# Patient Record
Sex: Female | Born: 1975 | Race: White | Marital: Married | Smoking: Never smoker
Health system: Southern US, Community
[De-identification: ages and names within clinical notes are randomized; demographics above are authoritative.]

## PROBLEM LIST (undated history)

## (undated) DIAGNOSIS — Z992 Dependence on renal dialysis: Secondary | ICD-10-CM

## (undated) DIAGNOSIS — E039 Hypothyroidism, unspecified: Secondary | ICD-10-CM

## (undated) DIAGNOSIS — N1370 Vesicoureteral-reflux, unspecified: Secondary | ICD-10-CM

## (undated) DIAGNOSIS — I10 Essential (primary) hypertension: Secondary | ICD-10-CM

## (undated) HISTORY — PX: PERITONEAL CATHETER INSERTION: SHX2223

## (undated) HISTORY — PX: KIDNEY TRANSPLANT: SHX239

---

## 2010-09-26 ENCOUNTER — Other Ambulatory Visit: Payer: Self-pay | Admitting: Family Medicine

## 2010-09-26 ENCOUNTER — Ambulatory Visit
Admission: RE | Admit: 2010-09-26 | Discharge: 2010-09-26 | Disposition: A | Discharge status: Home / Self Care | Payer: Medicare Other | Source: Ambulatory Visit | Attending: Family Medicine | Admitting: Family Medicine

## 2010-09-26 DIAGNOSIS — M549 Dorsalgia, unspecified: Secondary | ICD-10-CM

## 2010-12-29 ENCOUNTER — Ambulatory Visit (INDEPENDENT_AMBULATORY_CARE_PROVIDER_SITE_OTHER): Payer: Medicare Other | Admitting: Internal Medicine

## 2011-02-07 ENCOUNTER — Inpatient Hospital Stay (INDEPENDENT_AMBULATORY_CARE_PROVIDER_SITE_OTHER)
Admission: RE | Admit: 2011-02-07 | Discharge: 2011-02-07 | Disposition: A | Discharge status: Home / Self Care | Payer: Medicare Other | Source: Ambulatory Visit | Attending: Family Medicine | Admitting: Family Medicine

## 2011-02-07 DIAGNOSIS — L03119 Cellulitis of unspecified part of limb: Secondary | ICD-10-CM

## 2011-02-10 ENCOUNTER — Inpatient Hospital Stay (HOSPITAL_COMMUNITY)
Admission: RE | Admit: 2011-02-10 | Discharge: 2011-02-10 | Disposition: A | Discharge status: Home / Self Care | Payer: Medicare Other | Source: Ambulatory Visit | Attending: Emergency Medicine | Admitting: Emergency Medicine

## 2011-07-08 IMAGING — CR DG SI JOINTS 3+V
3 series · 3 of 3 positions shown · non-contrast
Comparison: None.

CLINICAL DATA: Low back and hip pain, no injury

BILATERAL SACROILIAC JOINTS - 3+ VIEW

[view not recorded (1 of 3)]
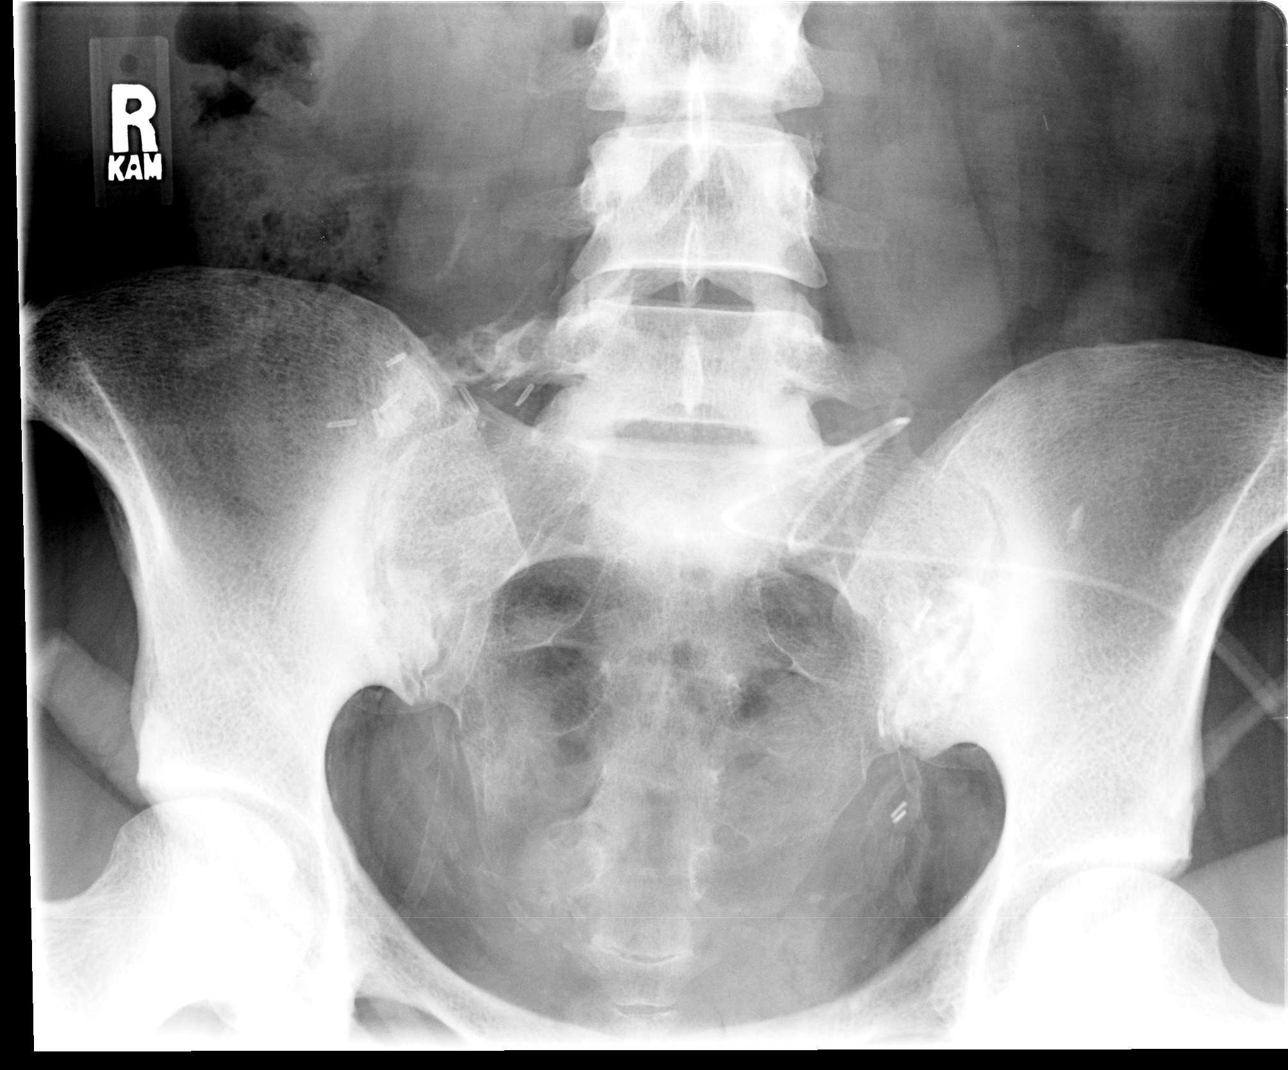

[view not recorded (2 of 3)]
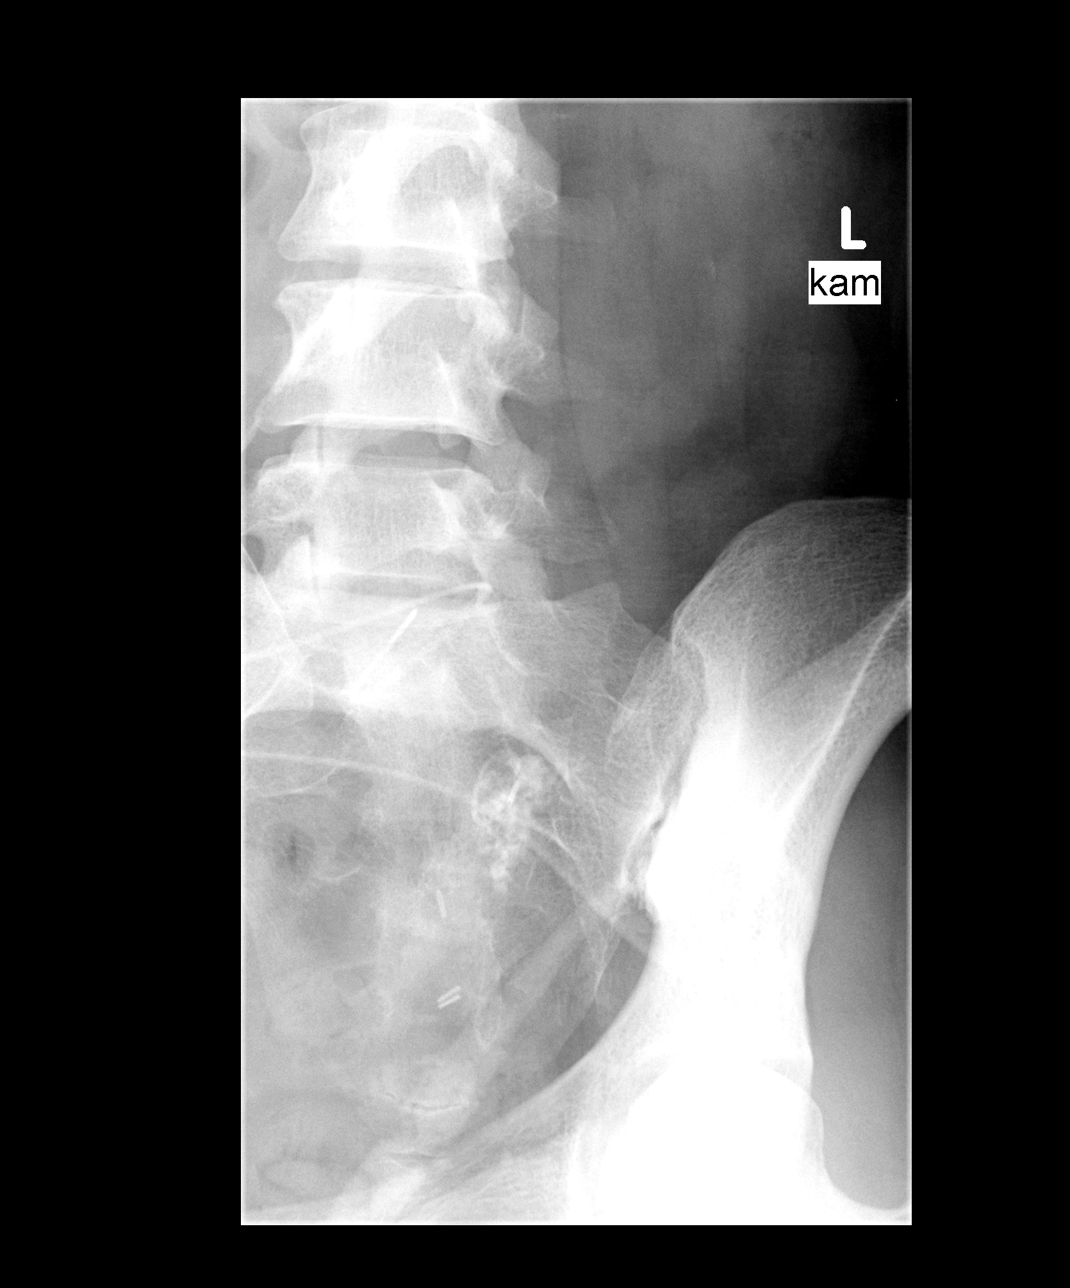

[view not recorded (3 of 3)]
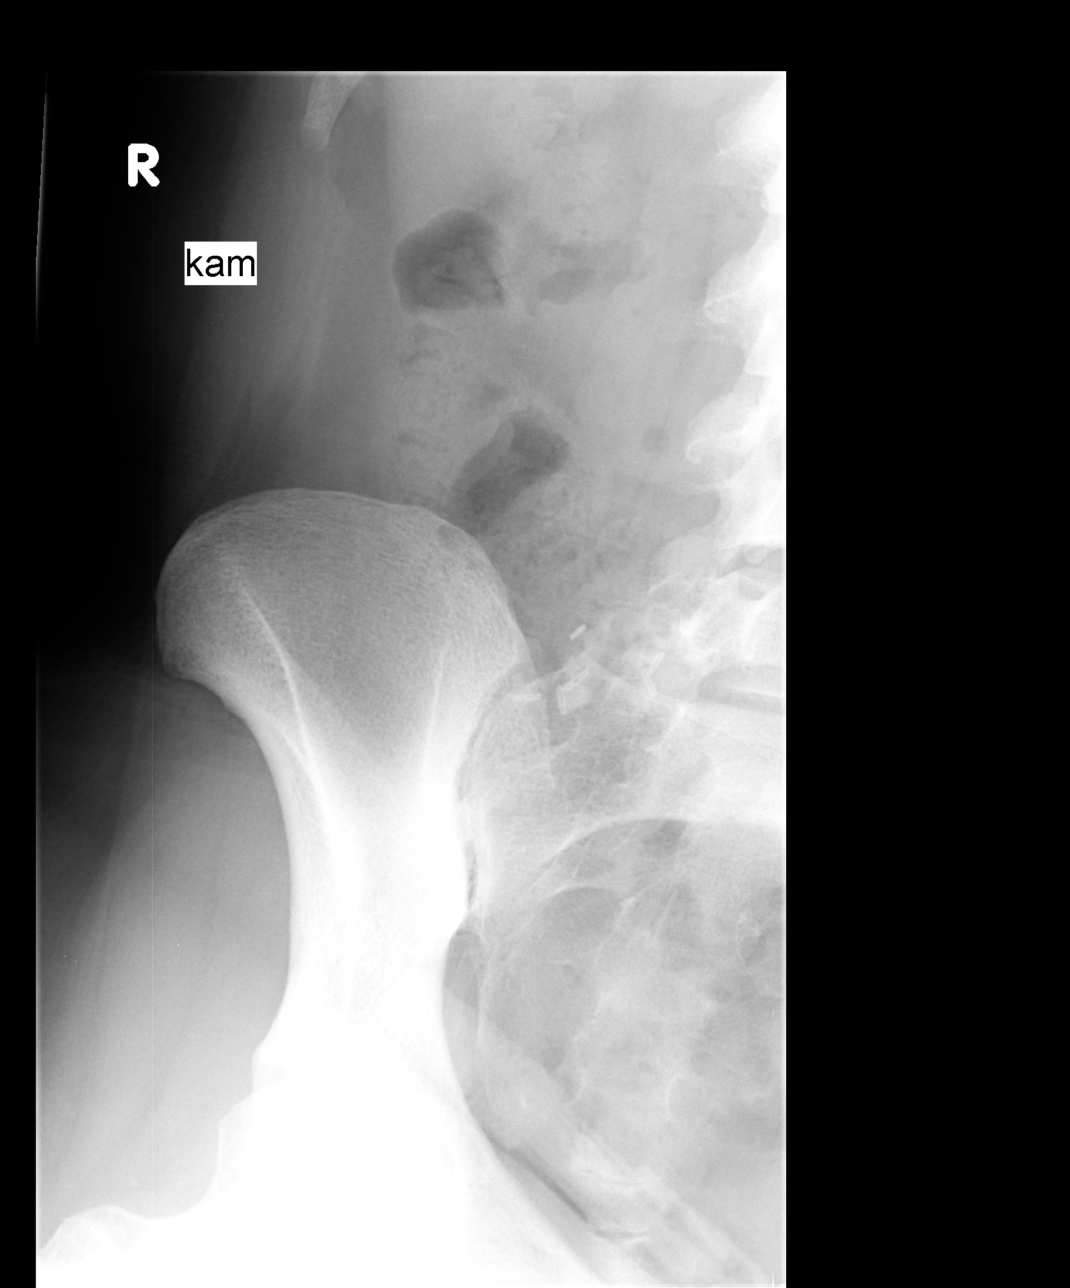

[3 of 3 positions shown; findings below may reference images not displayed]

FINDINGS: The SI joints are irregular, but the dominant findings
appear to be that of stress sclerosis.  Also the bones  appear
somewhat overall dense, and a systemic process such as renal
osteodystrophy would be a consideration.  Correlate clinically.  On
the oblique views no definite evidence of sacroiliitis is seen.
The sacral foramina appear normally corticated.  Postoperative
changes are noted on the right medial iliac crest, and a drain
overlies the left lower quadrant.
IMPRESSION: Changes most consistent with stress sclerosis.  No definite
evidence of sacroiliitis.

## 2011-09-19 ENCOUNTER — Emergency Department (INDEPENDENT_AMBULATORY_CARE_PROVIDER_SITE_OTHER)
Admission: EM | Admit: 2011-09-19 | Discharge: 2011-09-19 | Disposition: A | Discharge status: Home / Self Care | Payer: Medicare Other | Source: Home / Self Care | Attending: Family Medicine | Admitting: Family Medicine

## 2011-09-19 ENCOUNTER — Other Ambulatory Visit: Payer: Self-pay

## 2011-09-19 ENCOUNTER — Encounter (HOSPITAL_COMMUNITY): Payer: Self-pay | Admitting: *Deleted

## 2011-09-19 DIAGNOSIS — R002 Palpitations: Secondary | ICD-10-CM

## 2011-09-19 HISTORY — DX: Essential (primary) hypertension: I10

## 2011-09-19 HISTORY — DX: Vesicoureteral-reflux, unspecified: N13.70

## 2011-09-19 HISTORY — DX: Hypothyroidism, unspecified: E03.9

## 2011-09-19 HISTORY — DX: Dependence on renal dialysis: Z99.2

## 2011-09-19 LAB — POCT I-STAT, CHEM 8
BUN: 51 mg/dL — ABNORMAL HIGH (ref 6–23)
Chloride: 102 mEq/L (ref 96–112)
Creatinine, Ser: 16.6 mg/dL — ABNORMAL HIGH (ref 0.50–1.10)
Potassium: 4.4 mEq/L (ref 3.5–5.1)
Sodium: 139 mEq/L (ref 135–145)
TCO2: 27 mmol/L (ref 0–100)

## 2011-09-19 NOTE — ED Notes (Signed)
Per pt not feeling well x 3 days tired - today onset of palpitations/dizziness/headache/lightheaded -

## 2011-09-19 NOTE — ED Provider Notes (Signed)
History     CSN: [id number]  Arrival date & time 09/19/11  1531   First MD Initiated Contact with Patient 09/19/11 1552      Chief Complaint  Patient presents with  . Palpitations  . Dizziness  . Headache    (Consider location/radiation/quality/duration/timing/severity/associated sxs/prior treatment) HPI Comments: Kristy Meyer presents for evaluation of heart palpitations, weakness, and "not feeling well" over the last few days. She reports chronic kidney disease s/p transplants and does at home dialysis. She also reports recent anemia with Hgb of 8.0; and received an injection of Epo several days ago. She denies any fever.   Patient is a 36 y.o. female presenting with palpitations and headaches. The history is provided by the patient.  Palpitations  This is a new problem. The current episode started more than 2 days ago. The problem occurs constantly. The problem has not changed since onset.The problem is associated with an unknown factor. Associated symptoms include malaise/fatigue, irregular heartbeat, headaches, weakness and shortness of breath. Pertinent negatives include no near-syncope and no dizziness. Her past medical history is significant for anemia.  Headache The primary symptoms include headaches. Primary symptoms do not include dizziness.  The headache is associated with weakness.  Additional symptoms include weakness.    Past Medical History  Diagnosis Date  . Hypertension   . Hypothyroid   . Peritoneal dialysis status   . Urinary reflux     Past Surgical History  Procedure Date  . Kidney transplant   . Peritoneal catheter insertion     History reviewed. No pertinent family history.  History  Substance Use Topics  . Smoking status: Never Smoker   . Smokeless tobacco: Not on file  . Alcohol Use: No    OB History    Grav Para Term Preterm Abortions TAB SAB Ect Mult Living                  Review of Systems  Constitutional: Positive for  malaise/fatigue.  Eyes: Negative.   Respiratory: Positive for shortness of breath.   Cardiovascular: Positive for palpitations. Negative for near-syncope.  Gastrointestinal: Negative.   Genitourinary: Negative.   Musculoskeletal: Negative.   Skin: Negative.   Neurological: Positive for weakness and headaches. Negative for dizziness.    Allergies  Acyclovir and related; Betadine; Demerol; Oxycodone; and Sulfa antibiotics  Home Medications   Current Outpatient Rx  Name Route Sig Dispense Refill  . AMLODIPINE BESYLATE 10 MG PO TABS Oral Take 10 mg by mouth daily.    Marland Kitchen PHOSLO PO Oral Take by mouth.    Marland Kitchen CARVEDILOL PO Oral Take by mouth 2 (two) times daily.    Marland Kitchen CLONIDINE HCL 0.3 MG PO TABS Oral Take 0.3 mg by mouth 2 (two) times daily.    . IRON COMPLEX PO Oral Take by mouth.    Marland Kitchen LEVOTHYROXINE SODIUM 50 MCG PO TABS Oral Take 50 mcg by mouth daily.    Marland Kitchen LISINOPRIL PO Oral Take by mouth once.    . MULTIVITAMINS PO CAPS Oral Take 1 capsule by mouth daily.    Marland Kitchen OMEPRAZOLE 20 MG PO CPDR Oral Take 20 mg by mouth daily.      BP 152/108  Pulse 90  Temp(Src) 98.5 F (36.9 C) (Oral)  Resp 18  SpO2 99%  Physical Exam  Nursing note and vitals reviewed. Constitutional: She is oriented to person, place, and time. She appears well-developed and well-nourished.  HENT:  Head: Normocephalic and atraumatic.  Eyes: EOM are normal.  Neck: Normal range of motion.  Cardiovascular: Normal rate and regular rhythm.   Murmur heard.  Systolic murmur is present with a grade of 4/6  Pulmonary/Chest: Effort normal.  Musculoskeletal: Normal range of motion.  Neurological: She is alert and oriented to person, place, and time.  Skin: Skin is warm and dry.  Psychiatric: Her behavior is normal.    ED Course  Procedures (including critical care time)  Labs Reviewed  POCT I-STAT, CHEM 8 - Abnormal; Notable for the following:    BUN 51 (*)    Creatinine, Ser 16.60 (*)    Glucose, Bld 100 (*)     Calcium, Ion 0.97 (*)    Hemoglobin 9.5 (*)    HCT 28.0 (*)    All other components within normal limits  I-STAT, CHEM 8   No results found.   1. Heart palpitations       MDM  ECG shows NSR, rate 83, with PACs; no ST changes        Richardo Priest, MD 09/19/11 1722

## 2023-10-22 ENCOUNTER — Ambulatory Visit
Admission: EM | Admit: 2023-10-22 | Discharge: 2023-10-22 | Disposition: A | Discharge status: Home / Self Care | Payer: Medicare Other | Attending: Emergency Medicine | Admitting: Emergency Medicine

## 2023-10-22 DIAGNOSIS — R300 Dysuria: Secondary | ICD-10-CM | POA: Insufficient documentation

## 2023-10-22 LAB — POCT URINALYSIS DIP (MANUAL ENTRY)
Bilirubin, UA: NEGATIVE
Glucose, UA: NEGATIVE mg/dL
Ketones, POC UA: NEGATIVE mg/dL
Nitrite, UA: NEGATIVE
Protein Ur, POC: >=300 mg/dL — AB
Spec Grav, UA: 1.02 (ref 1.010–1.025)
Urobilinogen, UA: 0.2 U/dL
pH, UA: 5.5 (ref 5.0–8.0)

## 2023-10-22 MED ORDER — CEPHALEXIN 500 MG PO CAPS: 500.0000 mg | ORAL_CAPSULE | Freq: Three times a day (TID) | ORAL | 0 refills | Status: AC

## 2023-10-22 NOTE — ED Triage Notes (Signed)
 Pt presents with c/o nausea x last night  States she had a bladder infection in Jan and now has the same sxs. Pt reports urinary freq, dysuria X 5 days.

## 2023-10-22 NOTE — Discharge Instructions (Addendum)
 Your urinalysis does not currently show bacteria your urine will be sent to the lab to determine exactly which bacteria is present, if any changes need to be made to your medications you will be notified  Begin use of cephalexin every 8 hours for 5 days  You may use over-the-counter Azo to help minimize your symptoms until antibiotic removes bacteria, this medication will turn your urine orange  Increase your fluid intake through use of water  As always practice good hygiene, wiping front to back and avoidance of scented vaginal products to prevent further irritation  If symptoms continue to persist after use of medication or recur please follow-up with urgent care or your primary doctor as needed

## 2023-10-22 NOTE — ED Provider Notes (Signed)
 Renaldo Fiddler    CSN: [id number] Arrival date & time: 10/22/23  0906      History   Chief Complaint Chief Complaint  Patient presents with   Nausea   Dysuria   Urinary Frequency    HPI Kristy Meyer is a 48 y.o. female.   Patient presents for evaluation of dysuria, urinary frequency, incomplete bladder emptying and lower abdominal pressure present for 5 days, beginning to experience nausea without vomiting 1 day ago.  History of a kidney transplant.  Endorses reoccurring infection.  Denies fever, vaginal symptoms, flank pain.  Past Medical History:  Diagnosis Date   Hypertension    Hypothyroid    Peritoneal dialysis status (HCC)    Urinary reflux     There are no active problems to display for this patient.   Past Surgical History:  Procedure Laterality Date   KIDNEY TRANSPLANT     PERITONEAL CATHETER INSERTION      OB History   No obstetric history on file.      Home Medications    Prior to Admission medications   Medication Sig Start Date End Date Taking? Authorizing Provider  cephALEXin (KEFLEX) 500 MG capsule Take 1 capsule (500 mg total) by mouth 3 (three) times daily for 5 days. 10/22/23 10/27/23 Yes Arrion Burruel R, NP  amLODipine (NORVASC) 10 MG tablet Take 10 mg by mouth daily.    [provider]  Calcium Acetate, Phos Binder, (PHOSLO PO) Take by mouth.    [provider]  CARVEDILOL PO Take by mouth 2 (two) times daily.    [provider]  cloNIDine (CATAPRES) 0.3 MG tablet Take 0.3 mg by mouth 2 (two) times daily.    [provider]  Iron Combinations (IRON COMPLEX PO) Take by mouth.    [provider]  levothyroxine (SYNTHROID, LEVOTHROID) 50 MCG tablet Take 50 mcg by mouth daily.    [provider]  LISINOPRIL PO Take by mouth once.    [provider]  Multiple Vitamin (MULTIVITAMIN) capsule Take 1 capsule by mouth daily.    [provider]  omeprazole (PRILOSEC) 20  MG capsule Take 20 mg by mouth daily.    [provider]    Family History History reviewed. No pertinent family history.  Social History Social History   Tobacco Use   Smoking status: Never  Substance Use Topics   Alcohol use: No   Drug use: No     Allergies   Acyclovir and related, Betadine [povidone iodine], Demerol, Oxycodone, and Sulfa antibiotics   Review of Systems Review of Systems   Physical Exam Triage Vital Signs ED Triage Vitals  Encounter Vitals Group     BP 10/22/23 0918 (!) 162/92     Systolic BP Percentile --      Diastolic BP Percentile --      Pulse Rate 10/22/23 0918 61     Resp 10/22/23 0918 17     Temp 10/22/23 0918 98.1 F (36.7 C)     Temp Source 10/22/23 0918 Oral     SpO2 10/22/23 0918 98 %     Weight --      Height --      Head Circumference --      Peak Flow --      Pain Score 10/22/23 0917 2     Pain Loc --      Pain Education --      Exclude from Growth Chart --    No  data found.  Updated Vital Signs BP (!) 162/92 (BP Location: Left Arm)   Pulse 61   Temp 98.1 F (36.7 C) (Oral)   Resp 17   SpO2 98%   Visual Acuity Right Eye Distance:   Left Eye Distance:   Bilateral Distance:    Right Eye Near:   Left Eye Near:    Bilateral Near:     Physical Exam Constitutional:      Appearance: Normal appearance.  HENT:     Head: Normocephalic.  Abdominal:     Tenderness: There is abdominal tenderness in the suprapubic area. There is no right CVA tenderness or left CVA tenderness.  Neurological:     Mental Status: She is alert.      UC Treatments / Results  Labs (all labs ordered are listed, but only abnormal results are displayed) Labs Reviewed  POCT URINALYSIS DIP (MANUAL ENTRY) - Abnormal; Notable for the following components:      Result Value   Clarity, UA cloudy (*)    Blood, UA small (*)    Protein Ur, POC >=300 (*)    Leukocytes, UA Trace (*)    All other components within normal limits  URINE  CULTURE    EKG   Radiology No results found.  Procedures Procedures (including critical care time)  Medications Ordered in UC Medications - No data to display  Initial Impression / Assessment and Plan / UC Course  I have reviewed the triage vital signs and the nursing notes.  Pertinent labs & imaging results that were available during my care of the patient were reviewed by me and considered in my medical decision making (see chart for details).  Dysuria  Urinalysis showing hemoglobin, leukocytes, negative for nitrates, sent for culture, patient symptoms for prophylactically providing antibiotic, prescribed cephalexin, recommended care with follow-up as needed Final Clinical Impressions(s) / UC Diagnoses   Final diagnoses:  Dysuria     Discharge Instructions      Your urinalysis does not currently show bacteria your urine will be sent to the lab to determine exactly which bacteria is present, if any changes need to be made to your medications you will be notified  Begin use of cephalexin every 8 hours for 5 days  You may use over-the-counter Azo to help minimize your symptoms until antibiotic removes bacteria, this medication will turn your urine orange  Increase your fluid intake through use of water  As always practice good hygiene, wiping front to back and avoidance of scented vaginal products to prevent further irritation  If symptoms continue to persist after use of medication or recur please follow-up with urgent care or your primary doctor as needed    ED Prescriptions     Medication Sig Dispense Auth. Provider   cephALEXin (KEFLEX) 500 MG capsule Take 1 capsule (500 mg total) by mouth 3 (three) times daily for 5 days. 15 capsule Janus Vlcek, Elita Boone, NP      PDMP not reviewed this encounter.   Valinda Hoar, [place] 10/22/23 [phone]

## 2023-10-23 LAB — URINE CULTURE: Culture: NO GROWTH
